# Patient Record
Sex: Male | Born: 1960 | Race: White | Hispanic: No | Marital: Married | Smoking: Never smoker
Health system: Southern US, Community
[De-identification: ages and names within clinical notes are randomized; demographics above are authoritative.]

## PROBLEM LIST (undated history)

## (undated) DIAGNOSIS — N200 Calculus of kidney: Secondary | ICD-10-CM

## (undated) DIAGNOSIS — E8881 Metabolic syndrome: Secondary | ICD-10-CM

## (undated) DIAGNOSIS — E785 Hyperlipidemia, unspecified: Secondary | ICD-10-CM

## (undated) DIAGNOSIS — I10 Essential (primary) hypertension: Secondary | ICD-10-CM

## (undated) HISTORY — DX: Hyperlipidemia, unspecified: E78.5

## (undated) HISTORY — DX: Metabolic syndrome: E88.81

## (undated) HISTORY — DX: Metabolic syndrome: E88.810

## (undated) HISTORY — DX: Calculus of kidney: N20.0

## (undated) HISTORY — PX: CHOLECYSTECTOMY: SHX55

## (undated) HISTORY — DX: Essential (primary) hypertension: I10

## (undated) HISTORY — PX: OTHER SURGICAL HISTORY: SHX169

---

## 2010-07-24 ENCOUNTER — Ambulatory Visit (HOSPITAL_COMMUNITY): Payer: PRIVATE HEALTH INSURANCE

## 2010-07-24 ENCOUNTER — Ambulatory Visit (HOSPITAL_COMMUNITY)
Admission: RE | Admit: 2010-07-24 | Discharge: 2010-07-24 | Disposition: A | Discharge status: Home / Self Care | Payer: PRIVATE HEALTH INSURANCE | Source: Ambulatory Visit | Attending: Urology | Admitting: Urology

## 2010-07-24 DIAGNOSIS — I10 Essential (primary) hypertension: Secondary | ICD-10-CM | POA: Insufficient documentation

## 2010-07-24 DIAGNOSIS — N201 Calculus of ureter: Secondary | ICD-10-CM | POA: Insufficient documentation

## 2010-07-24 LAB — CBC
HCT: 39.8 % (ref 39.0–52.0)
Hemoglobin: 13.8 g/dL (ref 13.0–17.0)
MCHC: 34.7 g/dL (ref 30.0–36.0)
MCV: 88.8 fL (ref 78.0–100.0)

## 2010-07-24 LAB — BASIC METABOLIC PANEL
BUN: 11 mg/dL (ref 6–23)
CO2: 25 mEq/L (ref 19–32)
Glucose, Bld: 107 mg/dL — ABNORMAL HIGH (ref 70–99)
Potassium: 3.5 mEq/L (ref 3.5–5.1)
Sodium: 140 mEq/L (ref 135–145)

## 2010-09-15 LAB — CBC
MCV: 86.4 fL (ref 78.0–100.0)
Platelets: 213 10*3/uL (ref 150–400)
RBC: 4.7 MIL/uL (ref 4.22–5.81)
WBC: 9.7 10*3/uL (ref 4.0–10.5)

## 2010-09-15 LAB — BASIC METABOLIC PANEL
Chloride: 100 mEq/L (ref 96–112)
GFR calc Af Amer: >60 mL/min (ref >60)
GFR calc non Af Amer: >60 mL/min (ref >60)
Potassium: 3.8 mEq/L (ref 3.5–5.1)
Sodium: 137 mEq/L (ref 135–145)

## 2010-09-18 ENCOUNTER — Ambulatory Visit (HOSPITAL_COMMUNITY)
Admission: RE | Admit: 2010-09-18 | Discharge: 2010-09-18 | Disposition: A | Discharge status: Home / Self Care | Payer: PRIVATE HEALTH INSURANCE | Source: Ambulatory Visit | Attending: Urology | Admitting: Urology

## 2010-09-18 ENCOUNTER — Ambulatory Visit (HOSPITAL_BASED_OUTPATIENT_CLINIC_OR_DEPARTMENT_OTHER)
Admission: RE | Admit: 2010-09-18 | Discharge: 2010-09-18 | Disposition: A | Discharge status: Home / Self Care | Payer: PRIVATE HEALTH INSURANCE | Source: Ambulatory Visit | Attending: Urology | Admitting: Urology

## 2010-09-18 DIAGNOSIS — K219 Gastro-esophageal reflux disease without esophagitis: Secondary | ICD-10-CM | POA: Insufficient documentation

## 2010-09-18 DIAGNOSIS — N201 Calculus of ureter: Secondary | ICD-10-CM | POA: Insufficient documentation

## 2010-09-18 DIAGNOSIS — R9431 Abnormal electrocardiogram [ECG] [EKG]: Secondary | ICD-10-CM | POA: Insufficient documentation

## 2010-09-18 DIAGNOSIS — Z01810 Encounter for preprocedural cardiovascular examination: Secondary | ICD-10-CM | POA: Insufficient documentation

## 2010-09-18 DIAGNOSIS — Z01812 Encounter for preprocedural laboratory examination: Secondary | ICD-10-CM | POA: Insufficient documentation

## 2010-09-18 DIAGNOSIS — R935 Abnormal findings on diagnostic imaging of other abdominal regions, including retroperitoneum: Secondary | ICD-10-CM | POA: Insufficient documentation

## 2010-09-18 DIAGNOSIS — I10 Essential (primary) hypertension: Secondary | ICD-10-CM | POA: Insufficient documentation

## 2010-09-25 NOTE — Op Note (Signed)
  NAMEPINKNEY, VENARD NO.:  [id number]  MEDICAL RECORD NO.:  [id number]           PATIENT TYPE:  O  LOCATION:  XRAY                         FACILITY:  [facility]  PHYSICIAN:  Courtney Paris, M.D.DATE OF BIRTH:  Oct 31, 1960  DATE OF PROCEDURE:  09/18/2010 DATE OF DISCHARGE:                              OPERATIVE REPORT   PREOPERATIVE DIAGNOSIS:  Left distal ureteral stone with obstruction.  POSTOPERATIVE DIAGNOSIS:  Left distal ureteral stone with obstruction.  OPERATION:  Cystoscopy, left retrograde pyelogram, left ureteroscopy with stone extraction plus fluoroscopy.  ANESTHESIA:  General.  SURGEON:  Courtney Paris, M.D.  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  This measured 7 x 9 mm at that time.  He was treated by lithotripsy on the upper ureter on July 24, 2010.  He did pass some small pieces, but still had a 7-mm fragment in the distal left ureter with intermittent obstruction.  He enters now for ureteroscopy and stone removal.  PROCEDURE IN DETAIL:  The patient was placed on the operating table in the dorsal lithotomy position.  After satisfactory induction of general anesthesia, he was prepped and draped with Betadine in the usual sterile fashion.  A time-out was then performed and the patient and the procedure were then reconfirmed.  The stone was still present on the KUB.  The urethra had to be dilated at the meatus up to 24-French so that the 21 panendoscope could be passed.  The anterior urethra was normal.  The posterior urethra with mildly enlarged prostate, but the bladder was entered.  The bladder was smooth, not trabeculated, but the left orifice was edematous.  I used a 5 open-ended ureteral catheter and inserted this in the orifice and performed a retrograde.  This demonstrated the stone present.  I used a Glidewire then to pass the stone and then I was  able to pass the 5 open-ended ureteral catheter beyond the stone.  I removed the Glidewire and inserted a guidewire for safety.  The ureteral catheter was then removed and a ureteral access sheath inner cannula was then used to dilate the distal ureter.  The 6- French ureteroscope was then passed up to and beyond the stone, where it was entrapped with a stone basket and removed intact.  There were a few small blood clots that seemed to pass after this, but I chose not to leave the stent since he lives an hour and 15 minutes away and did not want to have to return to have the stent removed.  The bladder was drained, the scope removed and I used some Xylocaine jelly in the urethra and gave him some Toradol.  He had a B and O suppository.  The patient was then taken to the recovery room in good condition and later will be discharged as an outpatient.     Courtney Paris, M.D.     HMK/MEDQ  D:  09/18/2010  T:  09/18/2010  Job:  [id number]  Electronically Signed by Vic Blackbird M.D. on 09/25/2010 05:20:31 PM

## 2012-01-23 IMAGING — CR DG ABDOMEN 1V
1 series · 1 of 1 positions shown · non-contrast
Comparison: None.

CLINICAL DATA: Left ureteral stone pre lithotripsy.

ABDOMEN - 1 VIEW

[t abdomen supine]
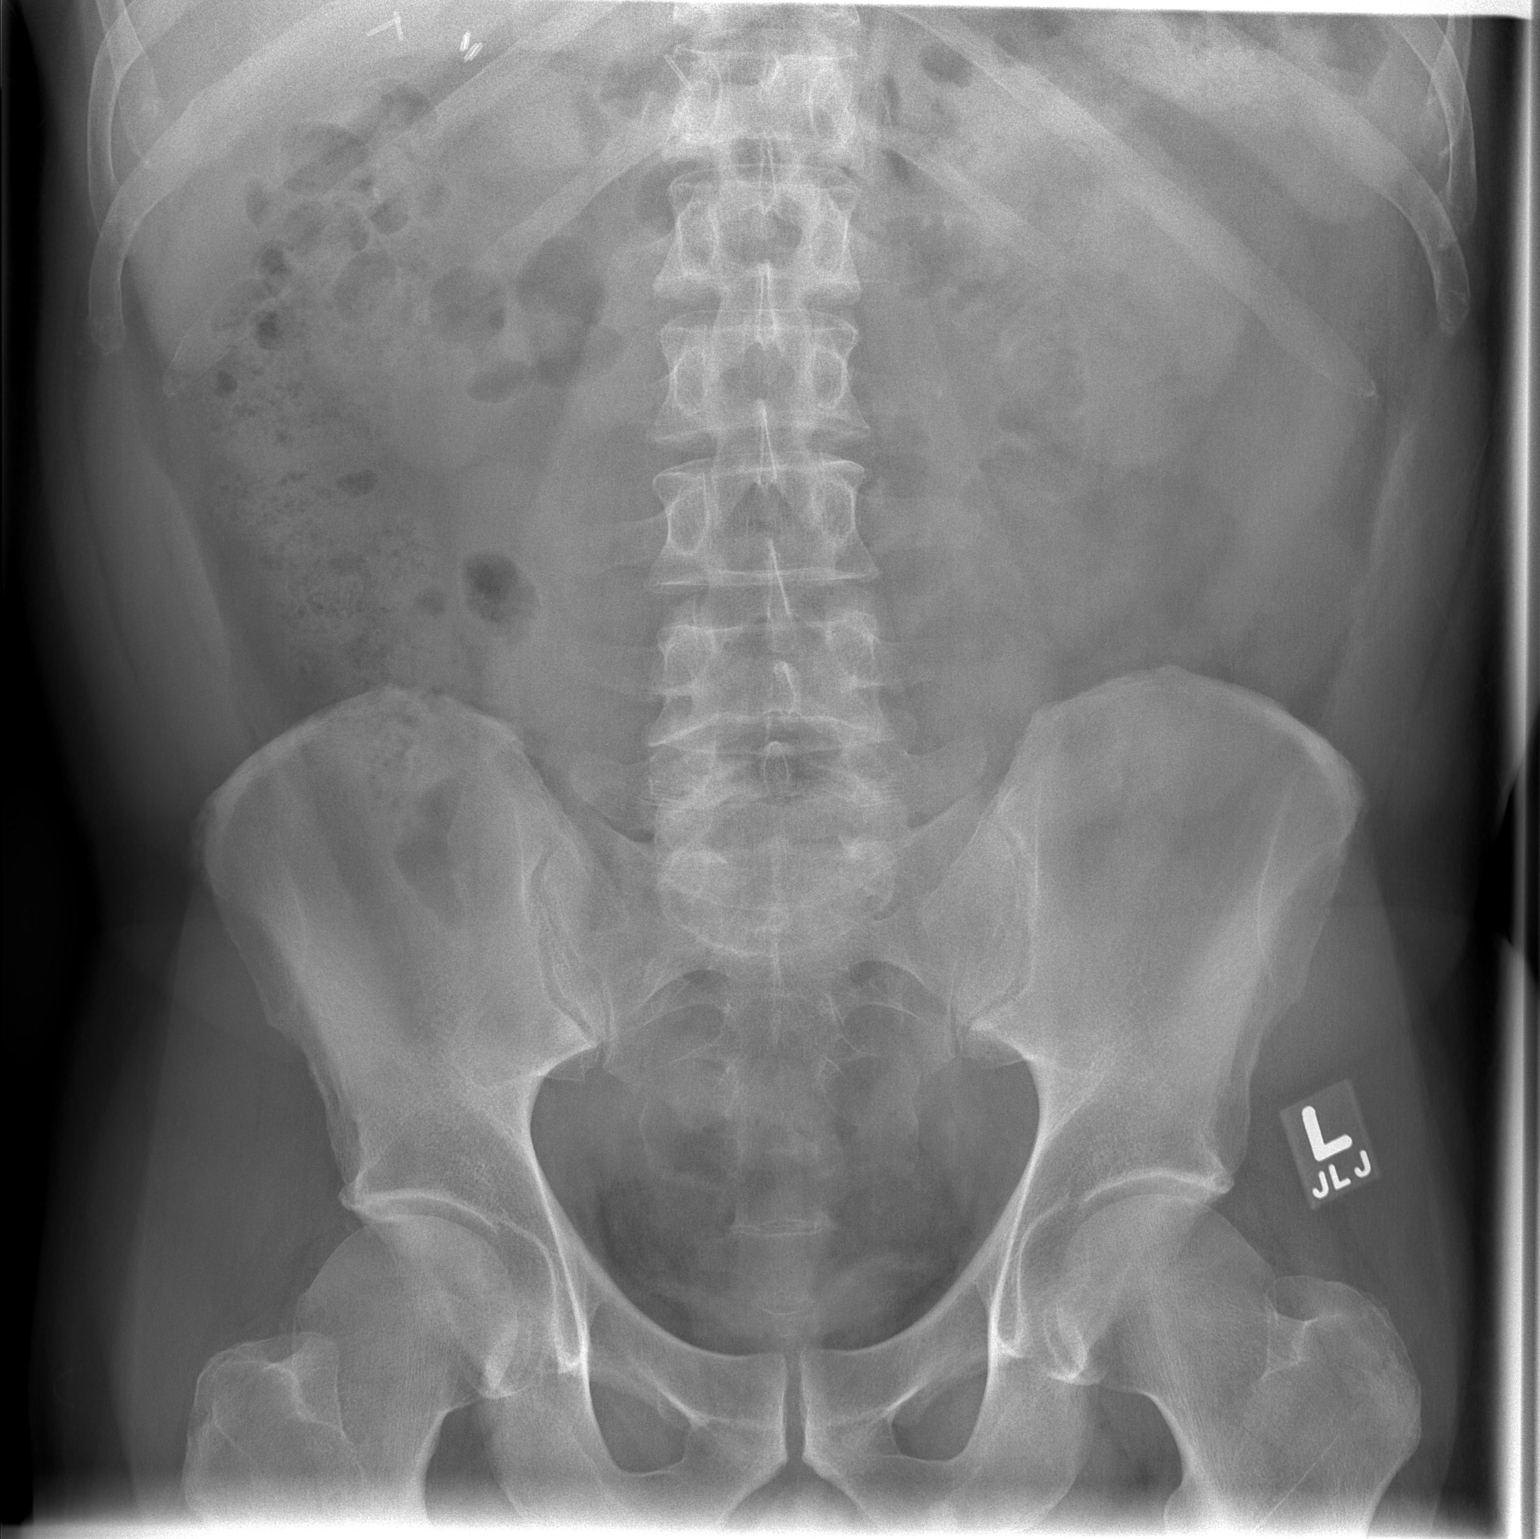

[1 of 1 positions shown; findings below may reference images not displayed]

FINDINGS: Radiopaque appearance of the tip of the left L2
transverse process.  Tiny ureteral stone at this level not excluded
although this may be related to the transverse process.

Radiopaque structure projecting over the left S1-S2 neural foramen.
This may represent overlying structures although ureteral stone not
excluded.

Rounded subtle radiopaque structures left pelvis may represent
phleboliths although ureteral calculus not excluded.  It would be
helpful to compare to any prior CT scans.

Right 4.6 x 2.3 mm renal stones suggested.  Post cholecystectomy.
IMPRESSION: Questionable left ureteral calculi as noted above.

## 2012-03-19 IMAGING — CR DG ABDOMEN 1V
2 series · 2 of 2 positions shown · non-contrast
Comparison: 07/24/2010

CLINICAL DATA: No stones.

ABDOMEN - 1 VIEW

[t abdomen supine (1 of 2)]
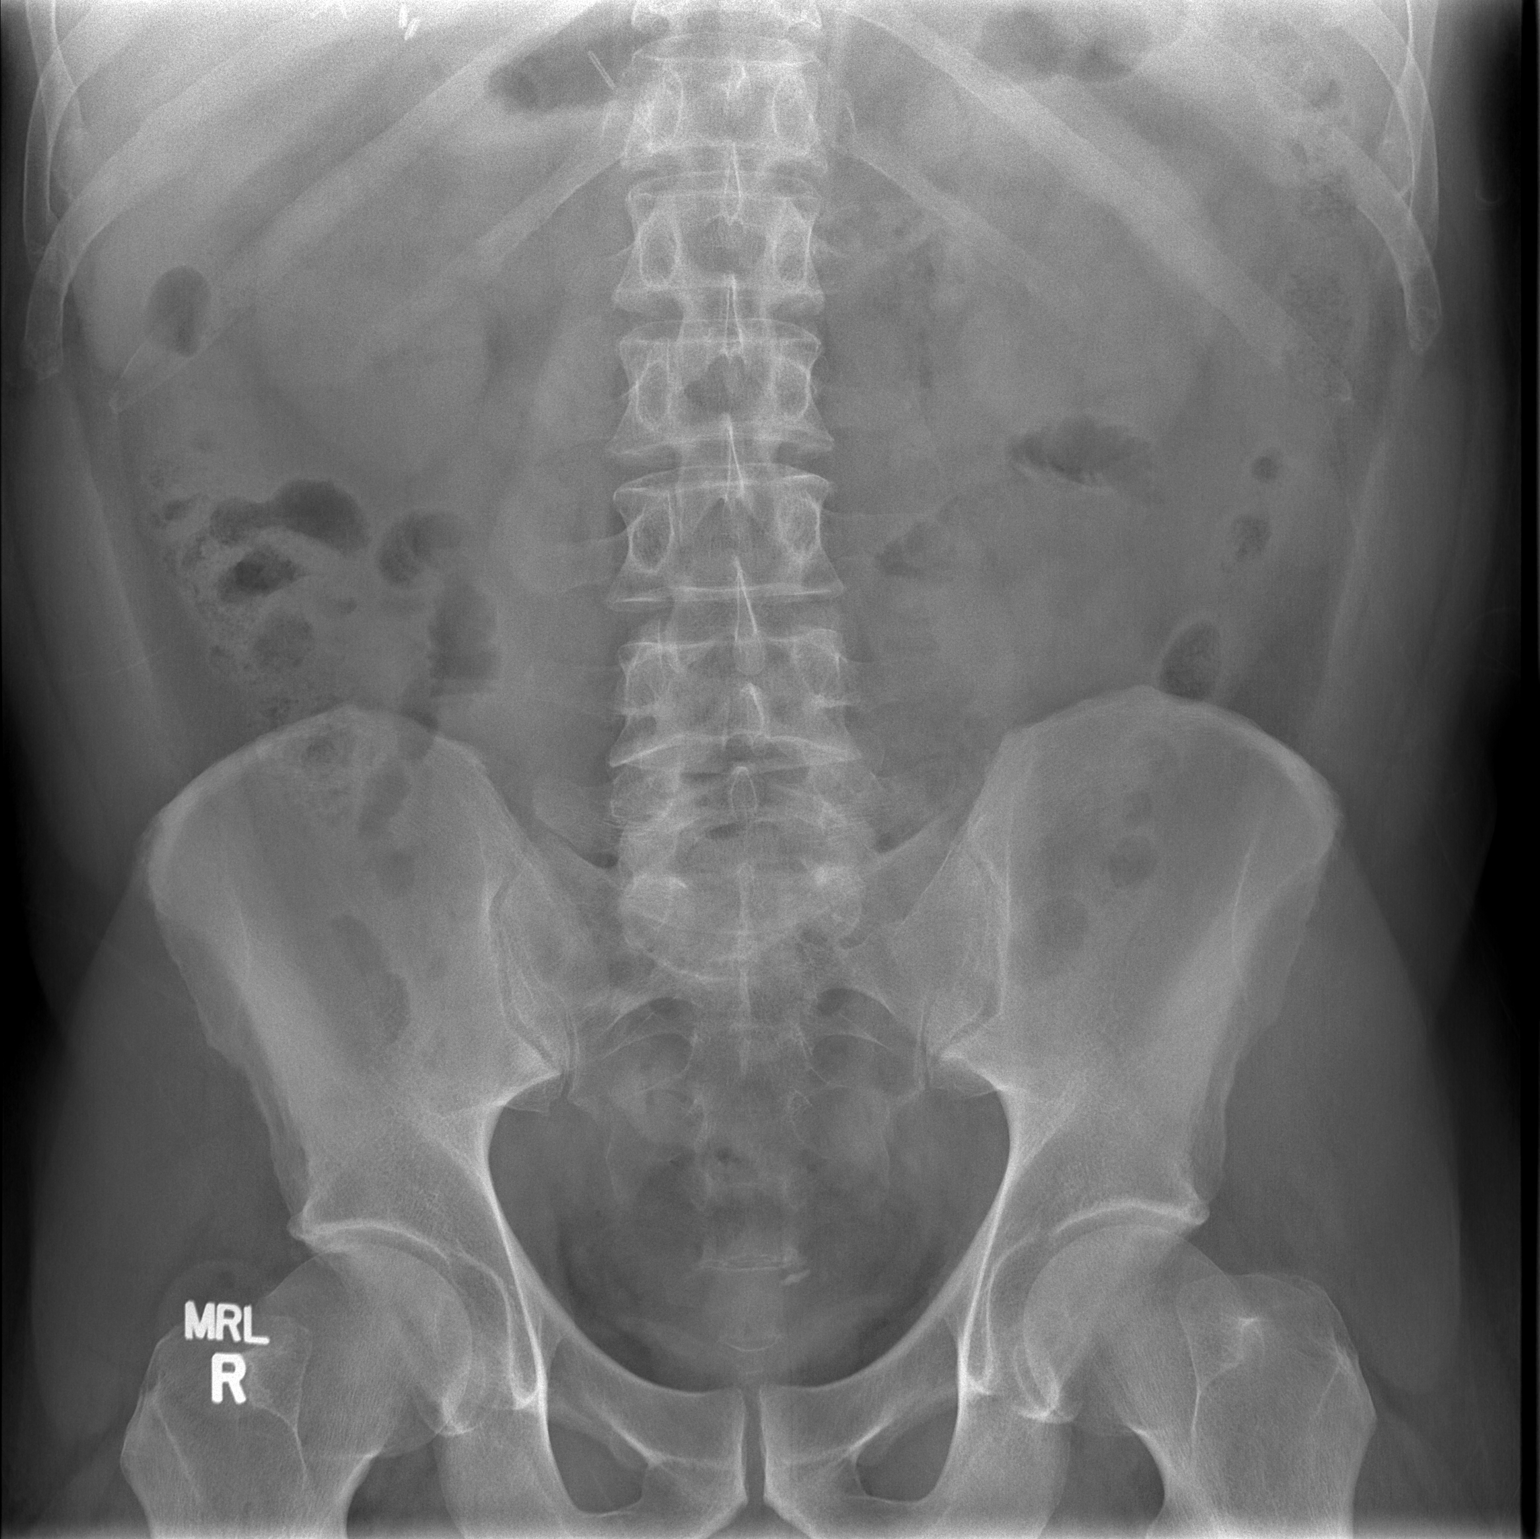

[t abdomen supine (2 of 2)]
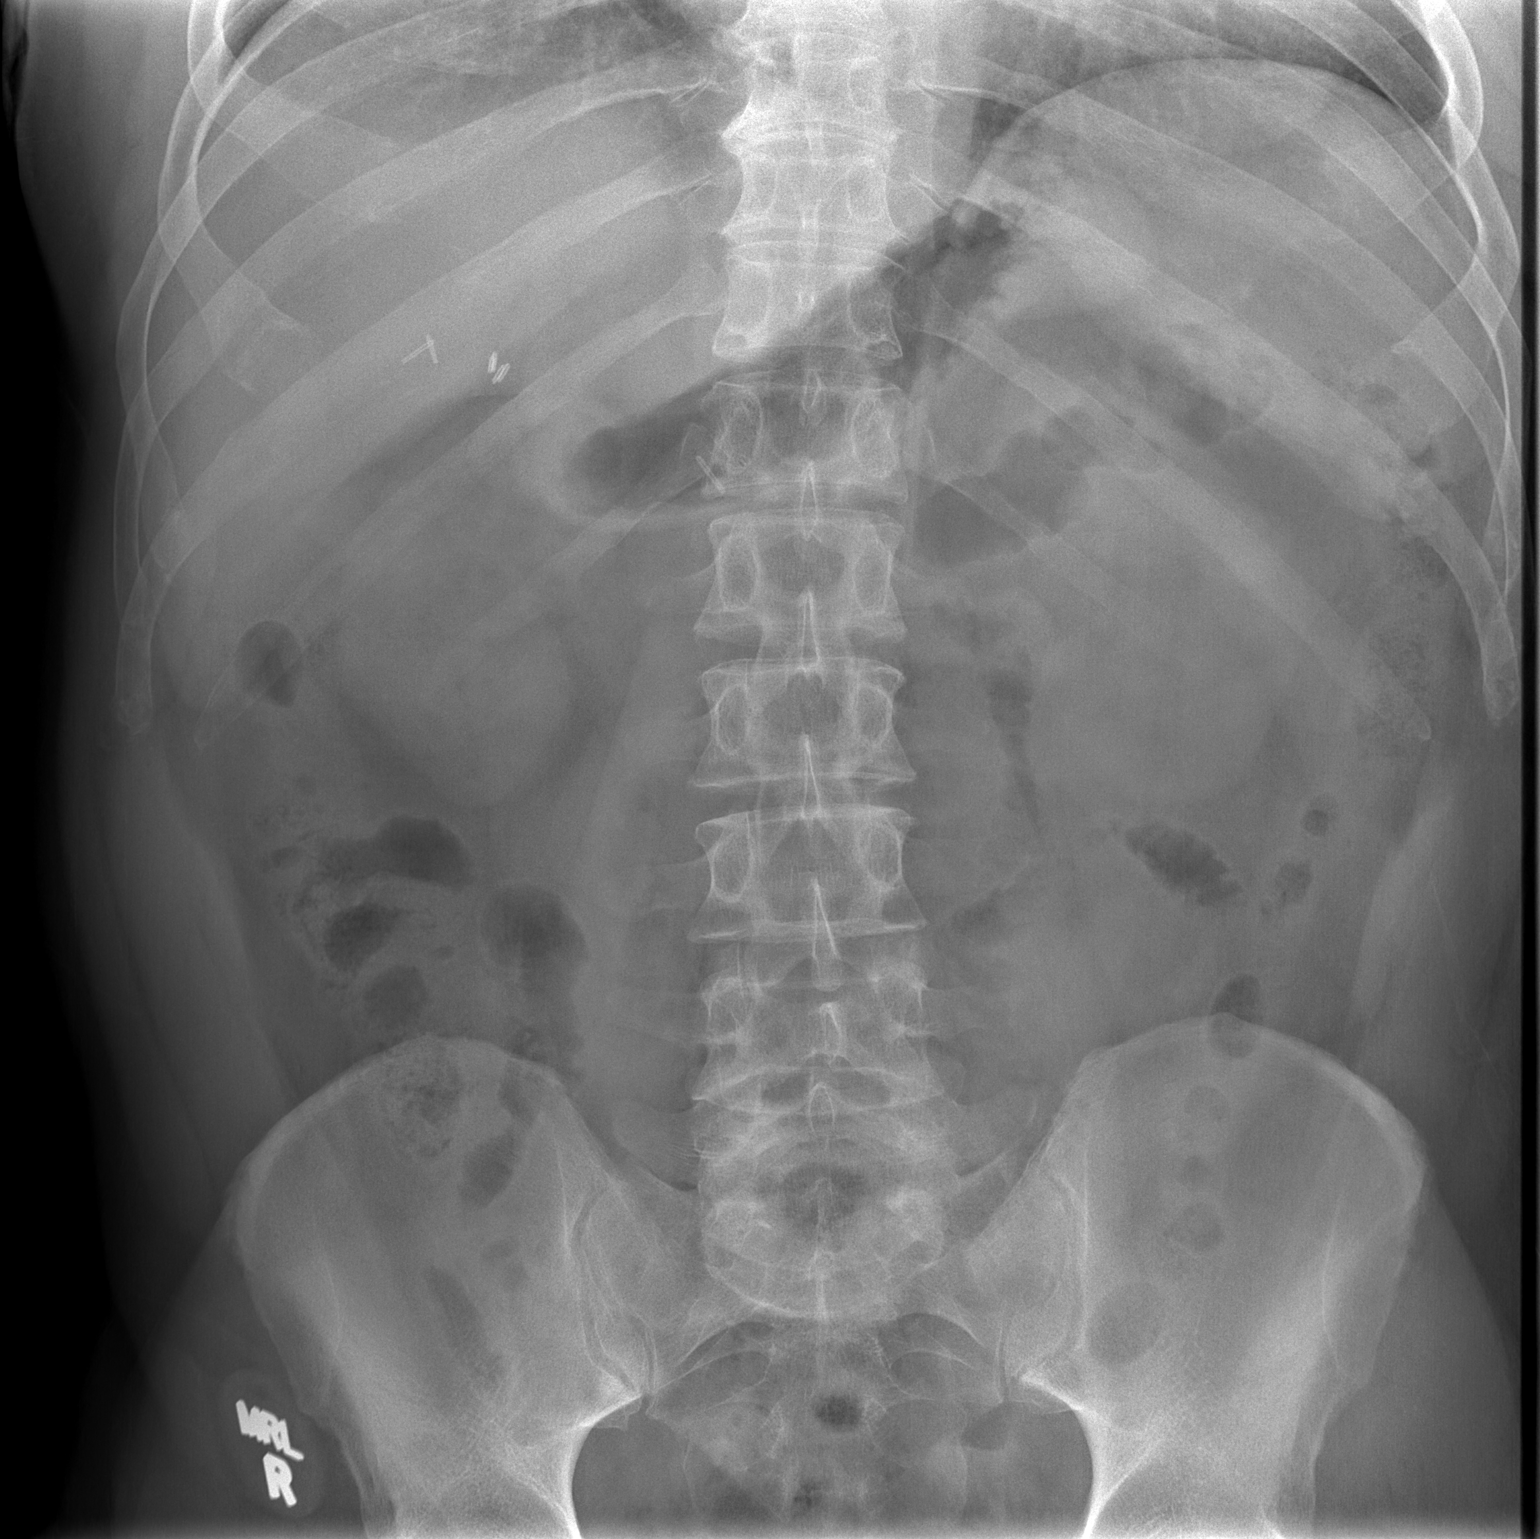

[2 of 2 positions shown; findings below may reference images not displayed]

FINDINGS: There appear to be a small cluster of stones near the
midline of the pelvis (just to the left of midline) which was not
present on prior study.  This likely reflects several distal left
ureteral stones.  No additional suspicious calcification.  Normal
bowel gas pattern.  No acute bony abnormality.
IMPRESSION: Suspect several adjacent small stones within the distal left ureter
at the left UVJ.

## 2012-09-19 ENCOUNTER — Encounter: Payer: Self-pay | Admitting: Family Medicine

## 2012-09-19 ENCOUNTER — Ambulatory Visit (INDEPENDENT_AMBULATORY_CARE_PROVIDER_SITE_OTHER): Payer: 59 | Admitting: Family Medicine

## 2012-09-19 VITALS — BP 126/77 | HR 43 | Temp 97.2°F | Wt 205.6 lb

## 2012-09-19 DIAGNOSIS — L0291 Cutaneous abscess, unspecified: Secondary | ICD-10-CM

## 2012-09-19 DIAGNOSIS — E669 Obesity, unspecified: Secondary | ICD-10-CM | POA: Insufficient documentation

## 2012-09-19 DIAGNOSIS — I10 Essential (primary) hypertension: Secondary | ICD-10-CM

## 2012-09-19 DIAGNOSIS — Z87442 Personal history of urinary calculi: Secondary | ICD-10-CM | POA: Insufficient documentation

## 2012-09-19 DIAGNOSIS — L0390 Cellulitis, unspecified: Secondary | ICD-10-CM

## 2012-09-19 DIAGNOSIS — E785 Hyperlipidemia, unspecified: Secondary | ICD-10-CM | POA: Insufficient documentation

## 2012-09-19 LAB — COMPLETE METABOLIC PANEL WITH GFR
ALT: 43 U/L (ref 0–53)
AST: 23 U/L (ref 0–37)
Albumin: 4.5 g/dL (ref 3.5–5.2)
Alkaline Phosphatase: 46 U/L (ref 39–117)
BUN: 15 mg/dL (ref 6–23)
CO2: 28 mEq/L (ref 19–32)
Calcium: 9.7 mg/dL (ref 8.4–10.5)
Chloride: 105 mEq/L (ref 96–112)
Creat: 1.1 mg/dL (ref 0.50–1.35)
GFR, Est African American: >89 mL/min
GFR, Est Non African American: 78 mL/min
Glucose, Bld: 110 mg/dL — ABNORMAL HIGH (ref 70–99)
Potassium: 3.8 mEq/L (ref 3.5–5.3)
Sodium: 142 mEq/L (ref 135–145)
Total Bilirubin: 1.2 mg/dL (ref 0.3–1.2)
Total Protein: 7.1 g/dL (ref 6.0–8.3)

## 2012-09-19 LAB — URIC ACID: Uric Acid, Serum: 5.2 mg/dL (ref 4.0–6.0)

## 2012-09-19 MED ORDER — PRAVASTATIN SODIUM 80 MG PO TABS: 80.0000 mg | ORAL_TABLET | Freq: Every day | ORAL | Status: DC

## 2012-09-19 MED ORDER — AMLODIPINE BESYLATE 5 MG PO TABS: 5.0000 mg | ORAL_TABLET | Freq: Every day | ORAL | Status: DC

## 2012-09-19 MED ORDER — POTASSIUM CITRATE ER 10 MEQ (1080 MG) PO TBCR: 10.0000 meq | EXTENDED_RELEASE_TABLET | Freq: Two times a day (BID) | ORAL | Status: DC

## 2012-09-19 MED ORDER — VALSARTAN-HYDROCHLOROTHIAZIDE 320-12.5 MG PO TABS: 1.0000 | ORAL_TABLET | Freq: Every day | ORAL | Status: DC

## 2012-09-19 MED ORDER — DOXYCYCLINE HYCLATE 100 MG PO TABS: 100.0000 mg | ORAL_TABLET | Freq: Two times a day (BID) | ORAL | Status: DC

## 2012-09-19 NOTE — Progress Notes (Signed)
Patient ID: Ruben Buck, male   DOB: 09/21/1960, 52 y.o.   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 admits to Compliance with medications. denies Problems with medications.   PMH/PSH: reviewed/updated in Epic  SH/FH: reviewed/updated in Epic  Allergies: reviewed/updated in Epic  Medications: reviewed/updated in Epic  Immunizations: reviewed/updated in Epic  ROS: As above in the HPI. All other systems are stable or negative.  OBJECTIVE: APPEARANCE:  Patient in no acute distress.The patient appeared well nourished and normally developed. Acyanotic. Waist: VITAL SIGNS:BP 126/77  Pulse 43  Temp(Src) 97.2 F (36.2 C) (Oral)  Wt 205 lb 9.6 oz (93.26 kg) WM central obesity. Lost 12 lbs. Looks better.  SKIN: warm and  Dry without  tattoos and scars. Multiple tick bite sites areas on trunk and extremities. Areas on the abdominal wall has redness and induration of 1/2 to 1 inch diameter.  HEAD and Neck: without JVD, Head and scalp: normal Eyes:No scleral icterus. Fundi normal, eye movements normal. Ears: Auricle normal, canal normal, Tympanic membranes normal, insufflation normal. Nose: normal Throat: normal Neck & thyroid: normal  CHEST & LUNGS: Chest wall: normal Lungs: Clear  CVS: Reveals the PMI to be normally located. Regular rhythm, First and Second Heart sounds are normal,  absence of murmurs, rubs or gallops. Peripheral vasculature: Radial pulses: normal Dorsal pedis pulses: normal Posterior  pulses: normal  ABDOMEN:  Appearance: normal Benign,, no organomegaly, no masses, no Abdominal Aortic enlargement. No Guarding , no rebound. No Bruits. Bowel sounds: normal  RECTAL: N/A GU: N/A  EXTREMETIES: nonedematous. Both Femoral and Pedal pulses are normal.  MUSCULOSKELETAL:  Spine: normal Joints: intact  NEUROLOGIC: oriented to time,place and person; nonfocal. Strength is normal Sensory is normal Reflexes are normal Cranial Nerves are normal.  ASSESSMENT: HTN (hypertension) - Plan: amLODipine (NORVASC) 5 MG tablet, valsartan-hydrochlorothiazide (DIOVAN-HCT) 320-12.5 MG per tablet, COMPLETE METABOLIC PANEL WITH GFR  Obesity, unspecified - Plan: COMPLETE METABOLIC PANEL WITH GFR  History of kidney stones - Plan: potassium citrate (UROCIT-K) 10 MEQ (1080 MG) SR tablet, COMPLETE METABOLIC PANEL WITH GFR, Uric acid  HLD (hyperlipidemia) - Plan: pravastatin (PRAVACHOL) 80 MG tablet, COMPLETE METABOLIC PANEL WITH GFR, NMR Lipoprofile with Lipids  Cellulitis - Plan: doxycycline (VIBRA-TABS) 100 MG tablet around tick bites of the abdominal wall.   PLAN: Orders Placed This Encounter  Procedures  . COMPLETE METABOLIC PANEL WITH GFR  . NMR Lipoprofile with Lipids  . Uric acid   Meds ordered this encounter  Medications  . DISCONTD: potassium citrate (UROCIT-K) 10 MEQ (1080 MG) SR tablet    Sig: Take 10 mEq by mouth 2 (two) times daily.   Marland Kitchen DISCONTD: pravastatin (PRAVACHOL) 80 MG tablet    Sig: Take 80 mg by mouth daily.   Marland Kitchen DISCONTD: valsartan-hydrochlorothiazide (DIOVAN-HCT) 320-12.5 MG per tablet    Sig: Take 1 tablet by mouth daily.   . cholecalciferol (VITAMIN D) 1000 UNITS tablet    Sig: Take 1,000 Units by mouth daily.  . Cranberry-Vitamin C-Vitamin E (CRANBERRY PLUS VITAMIN C) 4200-20-3 MG-MG-UNIT CAPS    Sig: Take 1 capsule by mouth 4 (four) times daily.  . niacin (SLO-NIACIN) 500  MG tablet    Sig: Take 500 mg by mouth daily.  Marland Kitchen DISCONTD: amLODipine  (NORVASC) 5 MG tablet    Sig: Take 5 mg by mouth.  Marland Kitchen aspirin 81 MG tablet    Sig: Take 81 mg by mouth 2 (two) times daily.  . fish oil-omega-3 fatty acids 1000 MG capsule    Sig: Take 2 g by mouth daily.  Marland Kitchen DISCONTD: amLODipine (NORVASC) 5 MG tablet    Sig: Take 1 tablet (5 mg total) by mouth daily.    Dispense:  30 tablet    Refill:  0  . amLODipine (NORVASC) 5 MG tablet    Sig: Take 1 tablet (5 mg total) by mouth daily.    Dispense:  30 tablet    Refill:  5  . valsartan-hydrochlorothiazide (DIOVAN-HCT) 320-12.5 MG per tablet    Sig: Take 1 tablet by mouth daily.    Dispense:  90 tablet    Refill:  3  . pravastatin (PRAVACHOL) 80 MG tablet    Sig: Take 1 tablet (80 mg total) by mouth daily.    Dispense:  90 tablet    Refill:  3  . potassium citrate (UROCIT-K) 10 MEQ (1080 MG) SR tablet    Sig: Take 1 tablet (10 mEq total) by mouth 2 (two) times daily.    Dispense:  90 tablet    Refill:  3  . doxycycline (VIBRA-TABS) 100 MG tablet    Sig: Take 1 tablet (100 mg total) by mouth 2 (two) times daily.    Dispense:  20 tablet    Refill:  0    Praised patient in regards to weight loss and lifestyle changes.  RTC 3 months. Hopefully he will have lost more weight and we can reduce the amlodipine?  Tatelyn Vanhecke P. Modesto Charon, M.D.

## 2012-09-20 LAB — NMR LIPOPROFILE WITH LIPIDS
Cholesterol, Total: 148 mg/dL
HDL Particle Number: 33.9 umol/L
HDL Size: 8.7 nm — ABNORMAL LOW
HDL-C: 40 mg/dL
LDL (calc): 84 mg/dL
LDL Particle Number: 1328 nmol/L — ABNORMAL HIGH
LDL Size: 20.2 nm — ABNORMAL LOW
LP-IR Score: 63 — ABNORMAL HIGH
Large HDL-P: 2.9 umol/L — ABNORMAL LOW
Large VLDL-P: 3.1 nmol/L — ABNORMAL HIGH
Small LDL Particle Number: 835 nmol/L — ABNORMAL HIGH
Triglycerides: 118 mg/dL
VLDL Size: 45.4 nm

## 2012-09-20 NOTE — Progress Notes (Signed)
Quick Note:  Lab result close to goal. No change in Medications for now. No Change in plans and follow up.  ______ 

## 2012-12-30 ENCOUNTER — Ambulatory Visit: Payer: 59 | Admitting: Family Medicine

## 2013-02-07 ENCOUNTER — Ambulatory Visit: Payer: 59 | Admitting: Family Medicine

## 2013-02-14 ENCOUNTER — Ambulatory Visit (INDEPENDENT_AMBULATORY_CARE_PROVIDER_SITE_OTHER): Payer: 59 | Admitting: Family Medicine

## 2013-02-14 ENCOUNTER — Encounter: Payer: Self-pay | Admitting: Family Medicine

## 2013-02-14 VITALS — BP 130/76 | HR 45 | Temp 97.4°F | Ht 67.0 in | Wt 209.4 lb

## 2013-02-14 DIAGNOSIS — I10 Essential (primary) hypertension: Secondary | ICD-10-CM | POA: Insufficient documentation

## 2013-02-14 DIAGNOSIS — E8881 Metabolic syndrome: Secondary | ICD-10-CM | POA: Insufficient documentation

## 2013-02-14 DIAGNOSIS — Z87442 Personal history of urinary calculi: Secondary | ICD-10-CM

## 2013-02-14 DIAGNOSIS — E785 Hyperlipidemia, unspecified: Secondary | ICD-10-CM | POA: Insufficient documentation

## 2013-02-14 DIAGNOSIS — N200 Calculus of kidney: Secondary | ICD-10-CM | POA: Insufficient documentation

## 2013-02-14 MED ORDER — AMLODIPINE BESYLATE 5 MG PO TABS: 5.0000 mg | ORAL_TABLET | Freq: Every day | ORAL | Status: AC

## 2013-02-14 MED ORDER — POTASSIUM CITRATE ER 10 MEQ (1080 MG) PO TBCR: 10.0000 meq | EXTENDED_RELEASE_TABLET | Freq: Two times a day (BID) | ORAL | Status: AC

## 2013-02-14 MED ORDER — VALSARTAN-HYDROCHLOROTHIAZIDE 320-12.5 MG PO TABS: 1.0000 | ORAL_TABLET | Freq: Every day | ORAL | Status: DC

## 2013-02-14 NOTE — Progress Notes (Signed)
Patient ID: Ruben Buck, male   DOB: 12-27-1960, 52 y.o.   MRN: [id number] SUBJECTIVE: CC: Chief Complaint  Patient presents with  . Follow-up    3 month follow up   needs all refills  90 day supply    HPI: Patient is here for follow up of hyperlipidemia/hypertension/kidney stones/obesity: denies Headache;denies Chest Pain;denies weakness;denies Shortness of Breath and orthopnea;denies Visual changes;denies palpitations;denies cough;denies pedal edema;denies symptoms of TIA or stroke;deniesClaudication symptoms. admits to Compliance with medications; denies Problems with medications.  Past Medical History  Diagnosis Date  . Metabolic syndrome   . Hyperlipidemia   . Hypertension   . Kidney stones    Past Surgical History  Procedure Laterality Date  . Cholecystectomy    . Left shoulder surgery     History   Social History  . Marital Status: Married    Spouse Name: N/A    Number of Children: N/A  . Years of Education: N/A   Occupational History  . Not on file.   Social History Main Topics  . Smoking status: Never Smoker   . Smokeless tobacco: Not on file  . Alcohol Use: No  . Drug Use: No  . Sexual Activity: Not on file   Other Topics Concern  . Not on file   Social History Narrative  . No narrative on file   Family History  Problem Relation Age of Onset  . Heart disease Father    Current Outpatient Prescriptions on File Prior to Visit  Medication Sig Dispense Refill  . aspirin 81 MG tablet Take 81 mg by mouth 2 (two) times daily.      . cholecalciferol (VITAMIN D) 1000 UNITS tablet Take 1,000 Units by mouth daily.      . Cranberry-Vitamin C-Vitamin E (CRANBERRY PLUS VITAMIN C) 4200-20-3 MG-MG-UNIT CAPS Take 1 capsule by mouth 4 (four) times daily.      . fish oil-omega-3 fatty acids 1000 MG capsule Take 2 g by mouth daily.      . niacin (SLO-NIACIN) 500 MG tablet Take 500 mg by mouth daily.      . pravastatin (PRAVACHOL) 80 MG tablet Take 1 tablet (80 mg  total) by mouth daily.  90 tablet  3   No current facility-administered medications on file prior to visit.   No Known Allergies  There is no immunization history on file for this patient. Prior to Admission medications   Medication Sig Start Date End Date Taking? Authorizing Provider  amLODipine (NORVASC) 5 MG tablet Take 1 tablet (5 mg total) by mouth daily. 02/14/13  Yes Ileana Ladd, MD  aspirin 81 MG tablet Take 81 mg by mouth 2 (two) times daily.   Yes Historical Provider, MD  cholecalciferol (VITAMIN D) 1000 UNITS tablet Take 1,000 Units by mouth daily.   Yes Historical Provider, MD  Cranberry-Vitamin C-Vitamin E (CRANBERRY PLUS VITAMIN C) 4200-20-3 MG-MG-UNIT CAPS Take 1 capsule by mouth 4 (four) times daily.   Yes Historical Provider, MD  fish oil-omega-3 fatty acids 1000 MG capsule Take 2 g by mouth daily.   Yes Historical Provider, MD  niacin (SLO-NIACIN) 500 MG tablet Take 500 mg by mouth daily.   Yes Historical Provider, MD  potassium citrate (UROCIT-K) 10 MEQ (1080 MG) SR tablet Take 1 tablet (10 mEq total) by mouth 2 (two) times daily. 02/14/13  Yes Ileana Ladd, MD  pravastatin (PRAVACHOL) 80 MG tablet Take 1 tablet (80 mg total) by mouth daily. 09/19/12  Yes Ileana Ladd, MD  valsartan-hydrochlorothiazide (DIOVAN-HCT) 320-12.5 MG per tablet Take 1 tablet by mouth daily. 02/14/13  Yes Ileana Ladd, MD      ROS: As above in the HPI. All other systems are stable or negative.  OBJECTIVE: APPEARANCE:  Patient in no acute distress.The patient appeared well nourished and normally developed. Acyanotic. Waist: VITAL SIGNS:BP 130/76  Pulse 45  Temp(Src) 97.4 F (36.3 C) (Oral)  Ht 5\' 7"  (1.702 m)  Wt 209 lb 6.4 oz (94.983 kg)  BMI 32.79 kg/m2  WM obese  SKIN: warm and  Dry without overt rashes, tattoos and scars  HEAD and Neck: without JVD, Head and scalp: normal Eyes:No scleral icterus. Fundi normal, eye movements normal. Ears: Auricle normal, canal normal,  Tympanic membranes normal, insufflation normal. Nose: normal Throat: normal Neck & thyroid: normal  CHEST & LUNGS: Chest wall: normal Lungs: Clear  CVS: Reveals the PMI to be normally located. Regular rhythm, First and Second Heart sounds are normal,  absence of murmurs, rubs or gallops. Peripheral vasculature: Radial pulses: normal Dorsal pedis pulses: normal Posterior pulses: normal  ABDOMEN:  Appearance: Obese. Benign, no organomegaly, no masses, no Abdominal Aortic enlargement. No Guarding , no rebound. No Bruits. Bowel sounds: normal  RECTAL: N/A GU: N/A  EXTREMETIES: nonedematous.  MUSCULOSKELETAL:  Spine: normal Joints: intact  NEUROLOGIC: oriented to time,place and person; nonfocal. Strength is normal Sensory is normal Reflexes are normal Cranial Nerves are normal.  ASSESSMENT: HTN (hypertension) - Plan: amLODipine (NORVASC) 5 MG tablet, valsartan-hydrochlorothiazide (DIOVAN-HCT) 320-12.5 MG per tablet, CMP14+EGFR  History of kidney stones - Plan: potassium citrate (UROCIT-K) 10 MEQ (1080 MG) SR tablet  HLD (hyperlipidemia) - Plan: NMR, lipoprofile   PLAN: Orders Placed This Encounter  Procedures  . CMP14+EGFR  . NMR, lipoprofile    Meds ordered this encounter  Medications  . amLODipine (NORVASC) 5 MG tablet    Sig: Take 1 tablet (5 mg total) by mouth daily.    Dispense:  90 tablet    Refill:  3  . valsartan-hydrochlorothiazide (DIOVAN-HCT) 320-12.5 MG per tablet    Sig: Take 1 tablet by mouth daily.    Dispense:  90 tablet    Refill:  3  . potassium citrate (UROCIT-K) 10 MEQ (1080 MG) SR tablet    Sig: Take 1 tablet (10 mEq total) by mouth 2 (two) times daily.    Dispense:  180 tablet    Refill:  3         Dr Woodroe Mode Recommendations  For nutrition information, I recommend books:  1).Eat to Live by Dr Monico Hoar. 2).Prevent and Reverse Heart Disease by Dr Suzzette Righter. 3) Dr Katherina Right Book:  Program to Reverse  Diabetes  Exercise recommendations are:  If unable to walk, then the patient can exercise in a chair 3 times a day. By flapping arms like a bird gently and raising legs outwards to the front.  If ambulatory, the patient can go for walks for 30 minutes 3 times a week. Then increase the intensity and duration as tolerated.  Goal is to try to attain exercise frequency to 5 times a week.  If applicable: Best to perform resistance exercises (machines or weights) 2 days a week and cardio type exercises 3 days per week.  Return in about 4 months (around 06/17/2013) for Recheck medical problems.  Damali Broadfoot P. Modesto Charon, M.D.

## 2013-02-14 NOTE — Patient Instructions (Addendum)
      Dr Arieonna Medine's Recommendations  For nutrition information, I recommend books:  1).Eat to Live by Dr Joel Fuhrman. 2).Prevent and Reverse Heart Disease by Dr Caldwell Esselstyn. 3) Dr Neal Barnard's Book:  Program to Reverse Diabetes  Exercise recommendations are:  If unable to walk, then the patient can exercise in a chair 3 times a day. By flapping arms like a bird gently and raising legs outwards to the front.  If ambulatory, the patient can go for walks for 30 minutes 3 times a week. Then increase the intensity and duration as tolerated.  Goal is to try to attain exercise frequency to 5 times a week.  If applicable: Best to perform resistance exercises (machines or weights) 2 days a week and cardio type exercises 3 days per week.  

## 2013-02-16 LAB — CMP14+EGFR
ALT: 38 IU/L (ref 0–44)
AST: 20 IU/L (ref 0–40)
Albumin/Globulin Ratio: 2 (ref 1.1–2.5)
Albumin: 4.5 g/dL (ref 3.5–5.5)
Alkaline Phosphatase: 45 IU/L (ref 39–117)
BUN/Creatinine Ratio: 11 (ref 9–20)
BUN: 12 mg/dL (ref 6–24)
CO2: 29 mmol/L (ref 18–29)
Calcium: 9.8 mg/dL (ref 8.7–10.2)
Chloride: 102 mmol/L (ref 97–108)
Creatinine, Ser: 1.05 mg/dL (ref 0.76–1.27)
GFR calc Af Amer: 95 mL/min/{1.73_m2} (ref >59)
GFR calc non Af Amer: 82 mL/min/{1.73_m2} (ref >59)
Globulin, Total: 2.2 g/dL (ref 1.5–4.5)
Glucose: 102 mg/dL — ABNORMAL HIGH (ref 65–99)
Potassium: 3.7 mmol/L (ref 3.5–5.2)
Sodium: 144 mmol/L (ref 134–144)
Total Bilirubin: 1 mg/dL (ref 0.0–1.2)
Total Protein: 6.7 g/dL (ref 6.0–8.5)

## 2013-02-16 LAB — NMR, LIPOPROFILE
Cholesterol: 155 mg/dL (ref <200)
HDL Cholesterol by NMR: 39 mg/dL — ABNORMAL LOW (ref >=40)
HDL Particle Number: 32.1 umol/L (ref >=30.5)
LDL Particle Number: 1345 nmol/L — ABNORMAL HIGH (ref <1000)
LDL Size: 20.1 nm — ABNORMAL LOW (ref >20.5)
LDLC SERPL CALC-MCNC: 86 mg/dL (ref <100)
LP-IR Score: 67 — ABNORMAL HIGH (ref <=45)
Small LDL Particle Number: 837 nmol/L — ABNORMAL HIGH (ref <=527)
Triglycerides by NMR: 152 mg/dL — ABNORMAL HIGH (ref <150)

## 2013-06-26 ENCOUNTER — Ambulatory Visit (INDEPENDENT_AMBULATORY_CARE_PROVIDER_SITE_OTHER): Payer: BC Managed Care – PPO | Admitting: Family Medicine

## 2013-06-26 VITALS — BP 127/78 | HR 49 | Temp 97.8°F | Ht 67.0 in | Wt 214.0 lb

## 2013-06-26 DIAGNOSIS — Z87442 Personal history of urinary calculi: Secondary | ICD-10-CM

## 2013-06-26 DIAGNOSIS — E785 Hyperlipidemia, unspecified: Secondary | ICD-10-CM

## 2013-06-26 DIAGNOSIS — E8881 Metabolic syndrome: Secondary | ICD-10-CM

## 2013-06-26 DIAGNOSIS — E669 Obesity, unspecified: Secondary | ICD-10-CM

## 2013-06-26 DIAGNOSIS — I10 Essential (primary) hypertension: Secondary | ICD-10-CM

## 2013-06-26 MED ORDER — PRAVASTATIN SODIUM 80 MG PO TABS: 80.0000 mg | ORAL_TABLET | Freq: Every day | ORAL | Status: AC

## 2013-06-26 MED ORDER — VALSARTAN-HYDROCHLOROTHIAZIDE 320-12.5 MG PO TABS: 1.0000 | ORAL_TABLET | Freq: Every day | ORAL | Status: AC

## 2013-06-26 NOTE — Patient Instructions (Signed)
Dr Woodroe Mode Recommendations  For nutrition information, I recommend books:  1).Eat to Live by Dr Monico Hoar. 2).Prevent and Reverse Heart Disease by Dr Suzzette Righter. 3) Dr Katherina Right Book:  Program to Reverse Diabetes  Exercise recommendations are:  If unable to walk, then the patient can exercise in a chair 3 times a day. By flapping arms like a bird gently and raising legs outwards to the front.  If ambulatory, the patient can go for walks for 30 minutes 3 times a week. Then increase the intensity and duration as tolerated.  Goal is to try to attain exercise frequency to 5 times a week.  If applicable: Best to perform resistance exercises (machines or weights) 2 days a week and cardio type exercises 3 days per week.   Metabolic Syndrome, Adult Metabolic syndrome descibes a group of risk factors for heart disease and diabetes. This syndrome has other names including Insulin Resistance Syndrome. The more risk factors you have, the higher your risk of having a heart attack, stroke, or developing diabetes. These risk factors include:  High blood sugar.  High blood triglyceride (a fat found in the blood) level.  High blood pressure.  Abdominal obesity (your extra weight is around your waist instead of your hips).  Low levels of high-density lipoprotein, HDL (good blood cholesterol). If you have any three of these risk factors, you have metabolic syndrome. If you have even one of these factors, you should make lifestyle changes to improve your health in order to prevent serious health diseases.  In people with metabolic syndrome, the cells do not respond properly to insulin. This can lead to high levels of glucose in the blood, which can interfere with normal body processes. Eventually, this can cause high blood pressure and higher fat levels in the blood, and inflammation of your blood vessels. The result can be heart disease and stroke.  CAUSES   Eating a diet  rich in calories and saturated fat.  Too little physical activity.  Being overweight. Other underlying causes are:  Family history (genetics).  Ethnicity (South Asians are at a higher risk).  Older age (your chances of developing metabolic syndrome are higher as you grow older).  Insulin resistance. SYMPTOMS  By itself, metabolic syndrome has no symptoms. However, you might have symptoms of diabetes (high blood sugar) or high blood pressure, such as:  Increased thirst, urination, and tiredness.  Dizzy spells.  Dull headaches that are unusual for you.  Blurred vision.  Nosebleeds. DIAGNOSIS  Your caregiver may make a diagnosis of metabolic syndrome if you have at least three of these factors:  If you are overweight mostly around the waist. This means a waistline greater than 40" in men and more than 35" in women. The waistline limits are 31 to 35 inches for women and 37 to 39 inches for men. In those who have certain genetic risk factors, such as having a family history of diabetes or being of Asian descent.  If you have a blood pressure of 130/85 mm Hg or more, or if you are being treated for high blood pressure.  If your blood triglyceride level is 150 mg/dL or more, or you are being treated for high levels of triglyceride.  If the level of HDL in your blood is below 40 mg/dL in men, less than 50 mg/dL in women, or you are receiving treatment for low levels of HDL.  If the level of sugar in your blood is high  with fasting blood sugar level of 110 mg/dL or more, or you are under treatment for diabetes. TREATMENT  Your caregiver may have you make lifestyle changes, which may include:  Exercise.  Losing weight.  Maintaining a healthy diet.  Quitting smoking. The lifestyle changes listed above are key in reducing your risk for heart disease and stroke. Medicines may also be prescribed to help your body respond to insulin better and to reduce your blood pressure and blood  fat levels. Aspirin may be recommended to reduce risks of heart disease or stroke.  HOME CARE INSTRUCTIONS   Exercise.  Measure your waist at regular intervals just above the hipbones after you have breathed out.  Maintain a healthy diet.  Eat fruits, such as apples, oranges, and pears.  Eat vegetables.  Eat legumes, such as kidney beans, peas, and lentils.  Eat food rich in soluble fiber, such as whole grain cereal, oatmeal, and oat bran.  Use olive or safflower oils and avoid saturated fats.  Eat nuts.  Limit the amount of salt you eat or add to food.  Limit the amount of alcohol you drink.  Include fish in your diet, if possible.  Stop smoking if you are a smoker.  Maintain regular follow-up appointments.  Follow your caregiver's advice. SEEK MEDICAL CARE IF:   You feel very tired or fatigued.  You develop excessive thirst.  You pass large quantities of urine.  You are putting on weight around your waist rather than losing weight.  You develop headaches over and over again.  You have off-and-on dizzy spells. SEEK IMMEDIATE MEDICAL CARE IF:   You develop nosebleeds.  You develop sudden blurred vision.  You develop sudden dizzy spells.  You develop chest pains, trouble breathing, or feel an abnormal or irregular heart beat.  You have a fainting episode.  You develop any sudden trouble speaking and/or swallowing.  You develop sudden weakness in one arm and/or one leg. MAKE SURE YOU:   Understand these instructions.  Will watch your condition.  Will get help right away if you are not doing well or get worse. Document Released: 08/04/2007 Document Revised: 07/20/2011 Document Reviewed: 08/04/2007 [facility] Patient Information 2014 [place], [place].   DASH Diet The DASH diet stands for "Dietary Approaches to Stop Hypertension." It is a healthy eating plan that has been shown to reduce high blood pressure (hypertension) in as little as 14 days, while  also possibly providing other significant health benefits. These other health benefits include reducing the risk of breast cancer after menopause and reducing the risk of type 2 diabetes, heart disease, colon cancer, and stroke. Health benefits also include weight loss and slowing kidney failure in patients with chronic kidney disease.  DIET GUIDELINES  Limit salt (sodium). Your diet should contain less than 1500 mg of sodium daily.  Limit refined or processed carbohydrates. Your diet should include mostly whole grains. Desserts and added sugars should be used sparingly.  Include small amounts of heart-healthy fats. These types of fats include nuts, oils, and tub margarine. Limit saturated and trans fats. These fats have been shown to be harmful in the body. CHOOSING FOODS  The following food groups are based on a 2000 calorie diet. See your Registered Dietitian for individual calorie needs. Grains and Grain Products (6 to 8 servings daily)  Eat More Often: Whole-wheat bread, brown rice, whole-grain or wheat pasta, quinoa, popcorn without added fat or salt (air popped).  Eat Less Often: White bread, white pasta, white rice, cornbread.  Vegetables (4 to 5 servings daily)  Eat More Often: Fresh, frozen, and canned vegetables. Vegetables may be raw, steamed, roasted, or grilled with a minimal amount of fat.  Eat Less Often/Avoid: Creamed or fried vegetables. Vegetables in a cheese sauce. Fruit (4 to 5 servings daily)  Eat More Often: All fresh, canned (in natural juice), or frozen fruits. Dried fruits without added sugar. One hundred percent fruit juice ( cup [237 mL] daily).  Eat Less Often: Dried fruits with added sugar. Canned fruit in light or heavy syrup. [employer], Fish, and Poultry (2 servings or less daily. One serving is 3 to 4 oz [85-114 g]).  Eat More Often: Ninety percent or leaner ground beef, tenderloin, sirloin. Round cuts of beef, chicken breast, [place] breast. All fish.  Grill, bake, or broil your meat. Nothing should be fried.  Eat Less Often/Avoid: Fatty cuts of meat, [place], or chicken leg, thigh, or wing. Fried cuts of meat or fish. Dairy (2 to 3 servings)  Eat More Often: Low-fat or fat-free milk, low-fat plain or light yogurt, reduced-fat or part-skim cheese.  Eat Less Often/Avoid: Milk (whole, 2%).Whole milk yogurt. Full-fat cheeses. Nuts, Seeds, and Legumes (4 to 5 servings per week)  Eat More Often: All without added salt.  Eat Less Often/Avoid: Salted nuts and seeds, canned beans with added salt. Fats and Sweets (limited)  Eat More Often: Vegetable oils, tub margarines without trans fats, sugar-free gelatin. Mayonnaise and salad dressings.  Eat Less Often/Avoid: Coconut oils, palm oils, butter, stick margarine, cream, half and half, cookies, candy, pie. FOR MORE INFORMATION The Dash Diet Eating Plan: www.dashdiet.org Document Released: 04/16/2011 Document Revised: 07/20/2011 Document Reviewed: 04/16/2011 [facility] Patient Information 2014 [place], [place].

## 2013-06-26 NOTE — Progress Notes (Signed)
Patient ID: Ruben Buck, male   DOB: [birth year], 53 y.o.   MRN: [id number] SUBJECTIVE: CC: Chief Complaint  Patient presents with  . follow up on chronic medical problems    HPI: Patient is here for follow up of hyperlipidemia/HTN/obesity: denies Headache;denies Chest Pain;denies weakness;denies Shortness of Breath and orthopnea;denies Visual changes;denies palpitations;denies cough;denies pedal edema;denies symptoms of TIA or stroke;deniesClaudication symptoms. admits to Compliance with medications; denies Problems with medications.  Diet: a little better Breakfast: juice Lunch : potato chips and sometimes nothing Supper: not a whole lot. Snacking.  Exercise : none. Past Medical History  Diagnosis Date  . Metabolic syndrome   . Hyperlipidemia   . Hypertension   . Kidney stones    Past Surgical History  Procedure Laterality Date  . Cholecystectomy    . Left shoulder surgery     History   Social History  . Marital Status: Married    Spouse Name: N/A    Number of Children: N/A  . Years of Education: N/A   Occupational History  . Not on file.   Social History Main Topics  . Smoking status: Never Smoker   . Smokeless tobacco: Not on file  . Alcohol Use: Yes  . Drug Use: No  . Sexual Activity: Not on file   Other Topics Concern  . Not on file   Social History Narrative  . No narrative on file   Family History  Problem Relation Age of Onset  . Heart disease Father    Current Outpatient Prescriptions on File Prior to Visit  Medication Sig Dispense Refill  . amLODipine (NORVASC) 5 MG tablet Take 1 tablet (5 mg total) by mouth daily.  90 tablet  3  . aspirin 81 MG tablet Take 81 mg by mouth 2 (two) times daily.      . cholecalciferol (VITAMIN D) 1000 UNITS tablet Take 1,000 Units by mouth daily.      . Cranberry-Vitamin C-Vitamin E (CRANBERRY PLUS VITAMIN C) 4200-20-3 MG-MG-UNIT CAPS Take 1 capsule by mouth 4 (four) times daily.      . fish oil-omega-3 fatty  acids 1000 MG capsule Take 2 g by mouth daily.      . niacin (SLO-NIACIN) 500 MG tablet Take 500 mg by mouth 2 (two) times daily.       . potassium citrate (UROCIT-K) 10 MEQ (1080 MG) SR tablet Take 1 tablet (10 mEq total) by mouth 2 (two) times daily.  180 tablet  3   No current facility-administered medications on file prior to visit.   No Known Allergies  There is no immunization history on file for this patient. Prior to Admission medications   Medication Sig Start Date End Date Taking? Authorizing Provider  amLODipine (NORVASC) 5 MG tablet Take 1 tablet (5 mg total) by mouth daily. 02/14/13  Yes Vernie Shanks, MD  aspirin 81 MG tablet Take 81 mg by mouth 2 (two) times daily.   Yes Historical Provider, MD  cholecalciferol (VITAMIN D) 1000 UNITS tablet Take 1,000 Units by mouth daily.   Yes Historical Provider, MD  Cranberry-Vitamin C-Vitamin E (CRANBERRY PLUS VITAMIN C) 4200-20-3 MG-MG-UNIT CAPS Take 1 capsule by mouth 4 (four) times daily.   Yes Historical Provider, MD  fish oil-omega-3 fatty acids 1000 MG capsule Take 2 g by mouth daily.   Yes Historical Provider, MD  niacin (SLO-NIACIN) 500 MG tablet Take 500 mg by mouth 2 (two) times daily.    Yes Historical Provider, MD  potassium citrate (UROCIT-K)  10 MEQ (1080 MG) SR tablet Take 1 tablet (10 mEq total) by mouth 2 (two) times daily. 02/14/13  Yes Vernie Shanks, MD  pravastatin (PRAVACHOL) 80 MG tablet Take 1 tablet (80 mg total) by mouth daily. 09/19/12  Yes Vernie Shanks, MD  valsartan-hydrochlorothiazide (DIOVAN-HCT) 320-12.5 MG per tablet Take 1 tablet by mouth daily. 02/14/13  Yes Vernie Shanks, MD     ROS: As above in the HPI. All other systems are stable or negative.  OBJECTIVE: APPEARANCE:  Patient in no acute distress.The patient appeared well nourished and normally developed. Acyanotic. Waist:41.5 inches VITAL SIGNS:BP 127/78  Pulse 49  Temp(Src) 97.8 F (36.6 C) (Oral)  Ht _0  (1.702 m)  Wt 214 lb (97.07 kg)   BMI 33.51 kg/m2 Obese WM  SKIN: warm and  Dry without overt rashes, tattoos and scars  HEAD and Neck: without JVD, Head and scalp: normal Eyes:No scleral icterus. Fundi normal, eye movements normal. Ears: Auricle normal, canal normal, Tympanic membranes normal, insufflation normal. Nose: normal Throat: normal Neck & thyroid: normal  CHEST & LUNGS: Chest wall: normal Lungs: Clear  CVS: Reveals the PMI to be normally located. Regular rhythm, First and Second Heart sounds are normal,  absence of murmurs, rubs or gallops. Peripheral vasculature: Radial pulses: normal Dorsal pedis pulses: normal Posterior pulses: normal  ABDOMEN:  Appearance: Obese Benign, no organomegaly, no masses, no Abdominal Aortic enlargement. No Guarding , no rebound. No Bruits. Bowel sounds: normal  RECTAL: N/A GU: N/A  EXTREMETIES: nonedematous.  MUSCULOSKELETAL:  Spine: normal Joints: intact  NEUROLOGIC: oriented to time,place and person; nonfocal. Strength is normal Sensory is normal Reflexes are normal Cranial Nerves are normal. Results for orders placed in visit on 02/14/13  CMP14+EGFR      Result Value Ref Range   Glucose 102 (*) 65 - 99 mg/dL   BUN 12  6 - 24 mg/dL   Creatinine, Ser 1.05  0.76 - 1.27 mg/dL   GFR calc non Af Amer 82  >59 mL/min/1.73   GFR calc Af Amer 95  >59 mL/min/1.73   BUN/Creatinine Ratio 11  9 - 20   Sodium 144  134 - 144 mmol/L   Potassium 3.7  3.5 - 5.2 mmol/L   Chloride 102  97 - 108 mmol/L   CO2 29  18 - 29 mmol/L   Calcium 9.8  8.7 - 10.2 mg/dL   Total Protein 6.7  6.0 - 8.5 g/dL   Albumin 4.5  3.5 - 5.5 g/dL   Globulin, Total 2.2  1.5 - 4.5 g/dL   Albumin/Globulin Ratio 2.0  1.1 - 2.5   Total Bilirubin 1.0  0.0 - 1.2 mg/dL   Alkaline Phosphatase 45  39 - 117 IU/L   AST 20  0 - 40 IU/L   ALT 38  0 - 44 IU/L  NMR, LIPOPROFILE      Result Value Ref Range   LDL Particle Number [id number] (*) <1000 nmol/L   LDLC SERPL CALC-MCNC 86  <100 mg/dL   HDL  Cholesterol by NMR 39 (*) >=40 mg/dL   Triglycerides by NMR 152 (*) <150 mg/dL   Cholesterol 155  <200 mg/dL   HDL Particle Number 32.1  >=30.5 umol/L   Small LDL Particle Number [id number] (*) <=527 nmol/L   LDL Size 20.1 (*) >20.5 nm   LP-IR Score 67 (*) <=45    ASSESSMENT: Obesity, unspecified  HTN (hypertension) - Plan: CMP14+EGFR, valsartan-hydrochlorothiazide (DIOVAN-HCT) 320-12.5 MG per tablet  History of kidney  stones  HLD (hyperlipidemia) - Plan: CMP14+EGFR, NMR, lipoprofile, pravastatin (PRAVACHOL) 80 MG tablet  Metabolic syndrome  PLAN:      Dr Paula Libra Recommendations  For nutrition information, I recommend books:  1).Eat to Live by Dr Excell Seltzer. 2).Prevent and Reverse Heart Disease by Dr Karl Luke. 3) Dr Janene Harvey Book:  Program to Reverse Diabetes  Exercise recommendations are:  If unable to walk, then the patient can exercise in a chair 3 times a day. By flapping arms like a bird gently and raising legs outwards to the front.  If ambulatory, the patient can go for walks for 30 minutes 3 times a week. Then increase the intensity and duration as tolerated.  Goal is to try to attain exercise frequency to 5 times a week.  If applicable: Best to perform resistance exercises (machines or weights) 2 days a week and cardio type exercises 3 days per week.  DASH diet in the AVS.  Encouraged to make the necessary LTC. Read the nutrition books.  Orders Placed This Encounter  Procedures  . CMP14+EGFR  . NMR, lipoprofile   Meds ordered this encounter  Medications  . pravastatin (PRAVACHOL) 80 MG tablet    Sig: Take 1 tablet (80 mg total) by mouth daily.    Dispense:  90 tablet    Refill:  3  . valsartan-hydrochlorothiazide (DIOVAN-HCT) 320-12.5 MG per tablet    Sig: Take 1 tablet by mouth daily.    Dispense:  90 tablet    Refill:  3   Medications Discontinued During This Encounter  Medication Reason  . pravastatin (PRAVACHOL) 80 MG tablet  Reorder  . valsartan-hydrochlorothiazide (DIOVAN-HCT) 320-12.5 MG per tablet Reorder   Return in about 3 months (around 09/23/2013) for Recheck medical problems.  Hamzeh Tall P. Jacelyn Grip, M.D.

## 2013-06-27 ENCOUNTER — Ambulatory Visit: Payer: 59 | Admitting: Family Medicine

## 2013-06-29 LAB — CMP14+EGFR
ALT: 44 IU/L (ref 0–44)
AST: 21 IU/L (ref 0–40)
Albumin/Globulin Ratio: 2.1 (ref 1.1–2.5)
Albumin: 4.5 g/dL (ref 3.5–5.5)
Alkaline Phosphatase: 47 IU/L (ref 39–117)
BUN/Creatinine Ratio: 12 (ref 9–20)
BUN: 13 mg/dL (ref 6–24)
CO2: 23 mmol/L (ref 18–29)
Calcium: 9.7 mg/dL (ref 8.7–10.2)
Chloride: 103 mmol/L (ref 97–108)
Creatinine, Ser: 1.06 mg/dL (ref 0.76–1.27)
GFR calc Af Amer: 94 mL/min/{1.73_m2} (ref >59)
GFR calc non Af Amer: 81 mL/min/{1.73_m2} (ref >59)
Globulin, Total: 2.1 g/dL (ref 1.5–4.5)
Glucose: 107 mg/dL — ABNORMAL HIGH (ref 65–99)
Potassium: 3.9 mmol/L (ref 3.5–5.2)
Sodium: 143 mmol/L (ref 134–144)
Total Bilirubin: 0.8 mg/dL (ref 0.0–1.2)
Total Protein: 6.6 g/dL (ref 6.0–8.5)

## 2013-06-29 LAB — NMR, LIPOPROFILE
Cholesterol: 149 mg/dL (ref <200)
HDL Cholesterol by NMR: 43 mg/dL (ref >=40)
HDL Particle Number: 30.4 umol/L — ABNORMAL LOW (ref >=30.5)
LDL Particle Number: 1128 nmol/L — ABNORMAL HIGH (ref <1000)
LDL Size: 20.3 nm — ABNORMAL LOW (ref >20.5)
LDLC SERPL CALC-MCNC: 80 mg/dL (ref <100)
LP-IR Score: 59 — ABNORMAL HIGH (ref <=45)
Small LDL Particle Number: 668 nmol/L — ABNORMAL HIGH (ref <=527)
Triglycerides by NMR: 132 mg/dL (ref <150)

## 2022-01-15 ENCOUNTER — Ambulatory Visit: Payer: PRIVATE HEALTH INSURANCE | Admitting: Internal Medicine
